# Patient Record
Sex: Male | Born: 1970 | Race: Black or African American | Hispanic: No | Marital: Married | Smoking: Never smoker
Health system: Southern US, Community
[De-identification: ages and names within clinical notes are randomized; demographics above are authoritative.]

## PROBLEM LIST (undated history)

## (undated) DIAGNOSIS — K219 Gastro-esophageal reflux disease without esophagitis: Secondary | ICD-10-CM

---

## 2017-10-20 ENCOUNTER — Other Ambulatory Visit: Payer: Self-pay

## 2017-10-20 ENCOUNTER — Ambulatory Visit
Admission: RE | Admit: 2017-10-20 | Discharge: 2017-10-20 | Disposition: A | Discharge status: Home / Self Care | Payer: Worker's Compensation | Source: Ambulatory Visit | Attending: Orthopedic Surgery | Admitting: Orthopedic Surgery

## 2017-10-20 ENCOUNTER — Other Ambulatory Visit: Payer: Self-pay | Admitting: Orthopedic Surgery

## 2017-10-20 DIAGNOSIS — R0602 Shortness of breath: Secondary | ICD-10-CM

## 2017-10-20 DIAGNOSIS — R0781 Pleurodynia: Secondary | ICD-10-CM

## 2017-10-31 ENCOUNTER — Ambulatory Visit (HOSPITAL_COMMUNITY): Payer: Worker's Compensation | Admitting: Certified Registered"

## 2017-10-31 ENCOUNTER — Encounter (HOSPITAL_COMMUNITY): Payer: Self-pay | Admitting: *Deleted

## 2017-10-31 ENCOUNTER — Encounter (HOSPITAL_COMMUNITY)
Admission: AD | Disposition: A | Discharge status: Home / Self Care | Payer: Self-pay | Source: Ambulatory Visit | Attending: Orthopedic Surgery

## 2017-10-31 ENCOUNTER — Observation Stay (HOSPITAL_COMMUNITY)
Admission: AD | Admit: 2017-10-31 | Discharge: 2017-11-01 | Disposition: A | Discharge status: Home / Self Care | Payer: Worker's Compensation | Source: Ambulatory Visit | Attending: Orthopedic Surgery | Admitting: Orthopedic Surgery

## 2017-10-31 ENCOUNTER — Other Ambulatory Visit: Payer: Self-pay

## 2017-10-31 DIAGNOSIS — W19XXXA Unspecified fall, initial encounter: Secondary | ICD-10-CM | POA: Diagnosis not present

## 2017-10-31 DIAGNOSIS — S2232XA Fracture of one rib, left side, initial encounter for closed fracture: Secondary | ICD-10-CM | POA: Diagnosis not present

## 2017-10-31 DIAGNOSIS — Z886 Allergy status to analgesic agent status: Secondary | ICD-10-CM | POA: Diagnosis not present

## 2017-10-31 DIAGNOSIS — M7022 Olecranon bursitis, left elbow: Secondary | ICD-10-CM | POA: Insufficient documentation

## 2017-10-31 DIAGNOSIS — S52501A Unspecified fracture of the lower end of right radius, initial encounter for closed fracture: Secondary | ICD-10-CM | POA: Diagnosis not present

## 2017-10-31 DIAGNOSIS — Z6836 Body mass index (BMI) 36.0-36.9, adult: Secondary | ICD-10-CM | POA: Insufficient documentation

## 2017-10-31 DIAGNOSIS — S46312A Strain of muscle, fascia and tendon of triceps, left arm, initial encounter: Secondary | ICD-10-CM | POA: Diagnosis not present

## 2017-10-31 DIAGNOSIS — S46319A Strain of muscle, fascia and tendon of triceps, unspecified arm, initial encounter: Secondary | ICD-10-CM | POA: Diagnosis present

## 2017-10-31 DIAGNOSIS — S46312S Strain of muscle, fascia and tendon of triceps, left arm, sequela: Secondary | ICD-10-CM

## 2017-10-31 HISTORY — PX: TRICEPS TENDON REPAIR: SHX2577

## 2017-10-31 HISTORY — PX: CAST APPLICATION: SHX380

## 2017-10-31 HISTORY — DX: Gastro-esophageal reflux disease without esophagitis: K21.9

## 2017-10-31 LAB — CBC
HCT: 42 % (ref 39.0–52.0)
HEMOGLOBIN: 13.4 g/dL (ref 13.0–17.0)
MCH: 26.1 pg (ref 26.0–34.0)
MCHC: 31.9 g/dL (ref 30.0–36.0)
MCV: 81.9 fL (ref 78.0–100.0)
PLATELETS: 288 10*3/uL (ref 150–400)
RBC: 5.13 MIL/uL (ref 4.22–5.81)
RDW: 15.8 % — AB (ref 11.5–15.5)
WBC: 7.7 10*3/uL (ref 4.0–10.5)

## 2017-10-31 SURGERY — REPAIR, TENDON, TRICEPS
Anesthesia: General | Laterality: Right

## 2017-10-31 MED ORDER — ONDANSETRON HCL 4 MG/2ML IJ SOLN
INTRAMUSCULAR | Status: AC
  Filled 2017-10-31: qty 2

## 2017-10-31 MED ORDER — CEFAZOLIN SODIUM-DEXTROSE 1-4 GM/50ML-% IV SOLN
1.0000 g | Freq: Three times a day (TID) | INTRAVENOUS | Status: DC
  Administered 2017-10-31 – 2017-11-01 (×2): 1 g via INTRAVENOUS
  Filled 2017-10-31 (×4): qty 50

## 2017-10-31 MED ORDER — FENTANYL CITRATE (PF) 100 MCG/2ML IJ SOLN: 25.0000 ug | INTRAMUSCULAR | Status: DC | PRN

## 2017-10-31 MED ORDER — DEXAMETHASONE SODIUM PHOSPHATE 10 MG/ML IJ SOLN
INTRAMUSCULAR | Status: DC | PRN
  Administered 2017-10-31: 10 mg via INTRAVENOUS

## 2017-10-31 MED ORDER — MIDAZOLAM HCL 2 MG/2ML IJ SOLN
INTRAMUSCULAR | Status: AC
  Filled 2017-10-31: qty 2

## 2017-10-31 MED ORDER — ACETAMINOPHEN 325 MG PO TABS: 325.0000 mg | ORAL_TABLET | ORAL | Status: DC | PRN

## 2017-10-31 MED ORDER — FENTANYL CITRATE (PF) 250 MCG/5ML IJ SOLN
INTRAMUSCULAR | Status: AC
  Filled 2017-10-31: qty 5

## 2017-10-31 MED ORDER — PROMETHAZINE HCL 12.5 MG RE SUPP
12.5000 mg | Freq: Four times a day (QID) | RECTAL | Status: DC | PRN
  Filled 2017-10-31: qty 1

## 2017-10-31 MED ORDER — ONDANSETRON HCL 4 MG/2ML IJ SOLN: 4.0000 mg | Freq: Once | INTRAMUSCULAR | Status: DC | PRN

## 2017-10-31 MED ORDER — OXYCODONE HCL 5 MG PO TABS
5.0000 mg | ORAL_TABLET | ORAL | Status: DC | PRN
  Administered 2017-10-31 – 2017-11-01 (×5): 10 mg via ORAL
  Filled 2017-10-31 (×5): qty 2

## 2017-10-31 MED ORDER — FAMOTIDINE 20 MG PO TABS: 20.0000 mg | ORAL_TABLET | Freq: Two times a day (BID) | ORAL | Status: DC | PRN

## 2017-10-31 MED ORDER — 0.9 % SODIUM CHLORIDE (POUR BTL) OPTIME
TOPICAL | Status: DC | PRN
  Administered 2017-10-31: 1000 mL

## 2017-10-31 MED ORDER — MORPHINE SULFATE (PF) 2 MG/ML IV SOLN
1.0000 mg | INTRAVENOUS | Status: DC | PRN
  Administered 2017-10-31 – 2017-11-01 (×3): 1 mg via INTRAVENOUS
  Filled 2017-10-31 (×3): qty 1

## 2017-10-31 MED ORDER — FENTANYL CITRATE (PF) 100 MCG/2ML IJ SOLN
INTRAMUSCULAR | Status: DC | PRN
  Administered 2017-10-31 (×6): 50 ug via INTRAVENOUS
  Administered 2017-10-31: 100 ug via INTRAVENOUS

## 2017-10-31 MED ORDER — PROPOFOL 10 MG/ML IV BOLUS
INTRAVENOUS | Status: AC
Start: 2017-10-31 — End: 2017-10-31
  Filled 2017-10-31: qty 20

## 2017-10-31 MED ORDER — MIDAZOLAM HCL 5 MG/5ML IJ SOLN
INTRAMUSCULAR | Status: DC | PRN
  Administered 2017-10-31: 1 mg via INTRAVENOUS

## 2017-10-31 MED ORDER — DOCUSATE SODIUM 100 MG PO CAPS
100.0000 mg | ORAL_CAPSULE | Freq: Two times a day (BID) | ORAL | Status: DC
  Administered 2017-10-31 – 2017-11-01 (×2): 100 mg via ORAL
  Filled 2017-10-31 (×2): qty 1

## 2017-10-31 MED ORDER — LACTATED RINGERS IV SOLN
INTRAVENOUS | Status: DC
  Administered 2017-11-01: 08:00:00 via INTRAVENOUS

## 2017-10-31 MED ORDER — KETOROLAC TROMETHAMINE 30 MG/ML IJ SOLN: 30.0000 mg | Freq: Once | INTRAMUSCULAR | Status: DC | PRN

## 2017-10-31 MED ORDER — OXYCODONE HCL 5 MG/5ML PO SOLN: 5.0000 mg | Freq: Once | ORAL | Status: DC | PRN

## 2017-10-31 MED ORDER — OXYCODONE HCL 5 MG PO TABS: 5.0000 mg | ORAL_TABLET | Freq: Once | ORAL | Status: DC | PRN

## 2017-10-31 MED ORDER — GLYCOPYRROLATE 0.2 MG/ML IV SOSY
PREFILLED_SYRINGE | INTRAVENOUS | Status: DC | PRN
Start: 2017-10-31 — End: 2017-10-31
  Administered 2017-10-31: 0.4 mg via INTRAVENOUS

## 2017-10-31 MED ORDER — ALPRAZOLAM 0.5 MG PO TABS: 0.5000 mg | ORAL_TABLET | Freq: Four times a day (QID) | ORAL | Status: DC | PRN

## 2017-10-31 MED ORDER — MIDAZOLAM HCL 2 MG/2ML IJ SOLN
INTRAMUSCULAR | Status: AC
  Administered 2017-10-31: 2 mg via INTRAVENOUS
  Filled 2017-10-31: qty 2

## 2017-10-31 MED ORDER — CEFAZOLIN SODIUM-DEXTROSE 1-4 GM/50ML-% IV SOLN: 1.0000 g | INTRAVENOUS | Status: DC

## 2017-10-31 MED ORDER — ACETAMINOPHEN 160 MG/5ML PO SOLN: 325.0000 mg | ORAL | Status: DC | PRN

## 2017-10-31 MED ORDER — ONDANSETRON HCL 4 MG/2ML IJ SOLN
INTRAMUSCULAR | Status: DC | PRN
  Administered 2017-10-31: 4 mg via INTRAVENOUS

## 2017-10-31 MED ORDER — METHOCARBAMOL 500 MG PO TABS
500.0000 mg | ORAL_TABLET | Freq: Four times a day (QID) | ORAL | Status: DC | PRN
  Administered 2017-10-31 – 2017-11-01 (×3): 500 mg via ORAL
  Filled 2017-10-31 (×3): qty 1

## 2017-10-31 MED ORDER — ONDANSETRON HCL 4 MG/2ML IJ SOLN: 4.0000 mg | Freq: Four times a day (QID) | INTRAMUSCULAR | Status: DC | PRN

## 2017-10-31 MED ORDER — MEPERIDINE HCL 50 MG/ML IJ SOLN: 6.2500 mg | INTRAMUSCULAR | Status: DC | PRN

## 2017-10-31 MED ORDER — FENTANYL CITRATE (PF) 100 MCG/2ML IJ SOLN
100.0000 ug | Freq: Once | INTRAMUSCULAR | Status: AC
  Administered 2017-10-31: 100 ug via INTRAVENOUS

## 2017-10-31 MED ORDER — ASPIRIN EC 325 MG PO TBEC
325.0000 mg | DELAYED_RELEASE_TABLET | Freq: Every day | ORAL | Status: DC
  Administered 2017-10-31 – 2017-11-01 (×2): 325 mg via ORAL
  Filled 2017-10-31 (×2): qty 1

## 2017-10-31 MED ORDER — LACTATED RINGERS IV SOLN
INTRAVENOUS | Status: DC
  Administered 2017-10-31 (×2): via INTRAVENOUS

## 2017-10-31 MED ORDER — CEFAZOLIN SODIUM 1 G IJ SOLR
INTRAMUSCULAR | Status: AC
  Filled 2017-10-31: qty 20

## 2017-10-31 MED ORDER — BUPIVACAINE HCL (PF) 0.25 % IJ SOLN
INTRAMUSCULAR | Status: AC
  Filled 2017-10-31: qty 30

## 2017-10-31 MED ORDER — LIDOCAINE 2% (20 MG/ML) 5 ML SYRINGE
INTRAMUSCULAR | Status: DC | PRN
  Administered 2017-10-31: 80 mg via INTRAVENOUS

## 2017-10-31 MED ORDER — CEFAZOLIN SODIUM-DEXTROSE 2-4 GM/100ML-% IV SOLN
2.0000 g | Freq: Once | INTRAVENOUS | Status: AC
  Administered 2017-10-31: 2 g via INTRAVENOUS

## 2017-10-31 MED ORDER — METHOCARBAMOL 1000 MG/10ML IJ SOLN
500.0000 mg | Freq: Four times a day (QID) | INTRAVENOUS | Status: DC | PRN
  Filled 2017-10-31: qty 5

## 2017-10-31 MED ORDER — MEPIVACAINE HCL 1.5 % IJ SOLN
INTRAMUSCULAR | Status: DC | PRN
  Administered 2017-10-31: 25 mL via EPIDURAL

## 2017-10-31 MED ORDER — MIDAZOLAM HCL 2 MG/2ML IJ SOLN
2.0000 mg | Freq: Once | INTRAMUSCULAR | Status: AC
  Administered 2017-10-31: 2 mg via INTRAVENOUS

## 2017-10-31 MED ORDER — FENTANYL CITRATE (PF) 100 MCG/2ML IJ SOLN
INTRAMUSCULAR | Status: AC
  Administered 2017-10-31: 100 ug via INTRAVENOUS
  Filled 2017-10-31: qty 2

## 2017-10-31 MED ORDER — ONDANSETRON HCL 4 MG PO TABS: 4.0000 mg | ORAL_TABLET | Freq: Four times a day (QID) | ORAL | Status: DC | PRN

## 2017-10-31 MED ORDER — SUGAMMADEX SODIUM 200 MG/2ML IV SOLN
INTRAVENOUS | Status: AC
  Filled 2017-10-31: qty 2

## 2017-10-31 MED ORDER — DEXAMETHASONE SODIUM PHOSPHATE 10 MG/ML IJ SOLN
INTRAMUSCULAR | Status: AC
  Filled 2017-10-31: qty 1

## 2017-10-31 MED ORDER — CEFAZOLIN SODIUM-DEXTROSE 2-4 GM/100ML-% IV SOLN
INTRAVENOUS | Status: AC
  Filled 2017-10-31: qty 100

## 2017-10-31 MED ORDER — ROCURONIUM BROMIDE 10 MG/ML (PF) SYRINGE
PREFILLED_SYRINGE | INTRAVENOUS | Status: AC
  Filled 2017-10-31: qty 5

## 2017-10-31 MED ORDER — VITAMIN C 500 MG PO TABS
1000.0000 mg | ORAL_TABLET | Freq: Every day | ORAL | Status: DC
  Administered 2017-10-31 – 2017-11-01 (×2): 1000 mg via ORAL
  Filled 2017-10-31 (×2): qty 2

## 2017-10-31 MED ORDER — PROPOFOL 10 MG/ML IV BOLUS
INTRAVENOUS | Status: DC | PRN
  Administered 2017-10-31: 200 mg via INTRAVENOUS

## 2017-10-31 SURGICAL SUPPLY — 69 items
BANDAGE ACE 3X5.8 VEL STRL LF (GAUZE/BANDAGES/DRESSINGS) IMPLANT
BANDAGE ACE 4X5 VEL STRL LF (GAUZE/BANDAGES/DRESSINGS) ×4 IMPLANT
BANDAGE ELASTIC 4 VELCRO ST LF (GAUZE/BANDAGES/DRESSINGS) ×12 IMPLANT
BNDG GAUZE ELAST 4 BULKY (GAUZE/BANDAGES/DRESSINGS) ×8 IMPLANT
CORDS BIPOLAR (ELECTRODE) ×8 IMPLANT
COVER SURGICAL LIGHT HANDLE (MISCELLANEOUS) ×4 IMPLANT
CUFF TOURNIQUET SINGLE 18IN (TOURNIQUET CUFF) IMPLANT
CUFF TOURNIQUET SINGLE 24IN (TOURNIQUET CUFF) ×4 IMPLANT
DECANTER SPIKE VIAL GLASS SM (MISCELLANEOUS) IMPLANT
DRAPE OEC MINIVIEW 54X84 (DRAPES) IMPLANT
DRAPE ORTHO SPLIT 77X108 STRL (DRAPES) ×2
DRAPE SURG 17X23 STRL (DRAPES) ×4 IMPLANT
DRAPE SURG ORHT 6 SPLT 77X108 (DRAPES) ×2 IMPLANT
DRSG ADAPTIC 3X8 NADH LF (GAUZE/BANDAGES/DRESSINGS) ×4 IMPLANT
EVACUATOR 1/8 PVC DRAIN (DRAIN) ×4 IMPLANT
GAUZE SPONGE 4X4 12PLY STRL (GAUZE/BANDAGES/DRESSINGS) ×4 IMPLANT
GAUZE XEROFORM 1X8 LF (GAUZE/BANDAGES/DRESSINGS) IMPLANT
GAUZE XEROFORM 5X9 LF (GAUZE/BANDAGES/DRESSINGS) ×4 IMPLANT
GLOVE BIOGEL M 8.0 STRL (GLOVE) IMPLANT
GLOVE BIOGEL PI IND STRL 6.5 (GLOVE) ×4 IMPLANT
GLOVE BIOGEL PI IND STRL 8 (GLOVE) ×2 IMPLANT
GLOVE BIOGEL PI INDICATOR 6.5 (GLOVE) ×4
GLOVE BIOGEL PI INDICATOR 8 (GLOVE) ×2
GLOVE SS BIOGEL STRL SZ 8 (GLOVE) ×2 IMPLANT
GLOVE SUPERSENSE BIOGEL SZ 8 (GLOVE) ×2
GLOVE SURG SS PI 6.5 STRL IVOR (GLOVE) ×8 IMPLANT
GOWN STRL REUS W/ TWL LRG LVL3 (GOWN DISPOSABLE) ×4 IMPLANT
GOWN STRL REUS W/ TWL XL LVL3 (GOWN DISPOSABLE) ×6 IMPLANT
GOWN STRL REUS W/TWL LRG LVL3 (GOWN DISPOSABLE) ×4
GOWN STRL REUS W/TWL XL LVL3 (GOWN DISPOSABLE) ×6
KIT BASIN OR (CUSTOM PROCEDURE TRAY) ×4 IMPLANT
KIT ROOM TURNOVER OR (KITS) ×4 IMPLANT
MANIFOLD NEPTUNE II (INSTRUMENTS) IMPLANT
NEEDLE HYPO 25GX1X1/2 BEV (NEEDLE) IMPLANT
NEEDLE KEITH (NEEDLE) IMPLANT
NEEDLE TAPERED W/ NITINOL LOOP (MISCELLANEOUS) ×4 IMPLANT
NS IRRIG 1000ML POUR BTL (IV SOLUTION) ×4 IMPLANT
PACK ORTHO EXTREMITY (CUSTOM PROCEDURE TRAY) ×4 IMPLANT
PAD ARMBOARD 7.5X6 YLW CONV (MISCELLANEOUS) ×8 IMPLANT
PAD CAST 4YDX4 CTTN HI CHSV (CAST SUPPLIES) ×2 IMPLANT
PADDING CAST COTTON 4X4 STRL (CAST SUPPLIES) ×2
PASSER SUT SWANSON 36MM LOOP (INSTRUMENTS) ×4 IMPLANT
SCOTCHCAST PLUS 2X4 WHITE (CAST SUPPLIES) ×12 IMPLANT
SCRUB BETADINE 4OZ XXX (MISCELLANEOUS) ×4 IMPLANT
SOL PREP POV-IOD 4OZ 10% (MISCELLANEOUS) ×4 IMPLANT
SPLINT FIBERGLASS 4X30 (CAST SUPPLIES) ×8 IMPLANT
SUCTION FRAZIER TIP 10 FR DISP (SUCTIONS) ×4 IMPLANT
SUT FIBERWIRE #2 38 T-5 BLUE (SUTURE)
SUT FIBERWIRE 2-0 18 17.9 3/8 (SUTURE) ×8
SUT PROLENE 3 0 PS 2 (SUTURE) ×12 IMPLANT
SUT PROLENE 4 0 PS 2 18 (SUTURE) IMPLANT
SUT VIC AB 2-0 CT1 27 (SUTURE)
SUT VIC AB 2-0 CT1 TAPERPNT 27 (SUTURE) IMPLANT
SUT VIC AB 3-0 SH 27 (SUTURE) ×2
SUT VIC AB 3-0 SH 27X BRD (SUTURE) ×2 IMPLANT
SUTURE FIBERWR #2 38 T-5 BLUE (SUTURE) IMPLANT
SUTURE FIBERWR 2-0 18 17.9 3/8 (SUTURE) ×4 IMPLANT
SUTURE TAPE 1.3 40 TPR END (SUTURE) ×6 IMPLANT
SUTURE TAPE FIBERLINK 1.3 LOOP (SUTURE) ×4 IMPLANT
SUTURETAPE 1.3 40 TPR END (SUTURE) ×12
SUTURETAPE FIBERLINK 1.3 LOOP (SUTURE) ×8
SWIVELOCK BIO CLOSED 4.75 (Orthopedic Implant) ×4 IMPLANT
SYR CONTROL 10ML LL (SYRINGE) IMPLANT
TOWEL OR 17X24 6PK STRL BLUE (TOWEL DISPOSABLE) ×4 IMPLANT
TOWEL OR 17X26 10 PK STRL BLUE (TOWEL DISPOSABLE) ×4 IMPLANT
TUBE CONNECTING 12'X1/4 (SUCTIONS)
TUBE CONNECTING 12X1/4 (SUCTIONS) IMPLANT
UNDERPAD 30X30 (UNDERPADS AND DIAPERS) ×4 IMPLANT
WATER STERILE IRR 1000ML POUR (IV SOLUTION) ×4 IMPLANT

## 2017-10-31 NOTE — Anesthesia Procedure Notes (Signed)
Procedure Name: LMA Insertion Date/Time: 10/31/2017 4:44 PM Performed by: Demetrio LappingSmith, Skyler Dusing P, CRNA Pre-anesthesia Checklist: Patient identified, Emergency Drugs available, Suction available and Patient being monitored Patient Re-evaluated:Patient Re-evaluated prior to induction Oxygen Delivery Method: Circle System Utilized Preoxygenation: Pre-oxygenation with 100% oxygen Induction Type: IV induction Ventilation: Mask ventilation without difficulty LMA: LMA inserted LMA Size: 5.0 Number of attempts: 1 Placement Confirmation: positive ETCO2 Tube secured with: Tape Dental Injury: Teeth and Oropharynx as per pre-operative assessment

## 2017-10-31 NOTE — H&P (Signed)
Stanley Alexander is an 47 y.o. male.   Chief Complaint: Patient presents with a left triceps tendon rupture as well as a right distal radius fracture.  He is casted on the right side at present time his left elbow is here for surgicalIntervention.  HPI: Patient presents for evaluation and care.  Patient has a history of fall 3 weeks ago with left triceps tendon rupture and a right distal radius fracture.  We are planning surgical intervention in the form of distal triceps repair and reconstruction on the left side with ulnar nerve release is necessary.  We are also planning a cast change on the right side.  He denies numbness tingling in lower extremities.  He denies abdominal pain or chest pain that is changed.  He has had some mild left chest wall pain due to a left fifth rib anterior fracture.  This was noted on CT of the chest.  I reviewed these issues with him at length.  He has had studies from outside emergency room 3 weeks ago revealing normal CT of the head and neck.  Past Medical History:  Diagnosis Date  . GERD (gastroesophageal reflux disease)     History reviewed. No pertinent surgical history.  History reviewed. No pertinent family history. Social History:  reports that  has never smoked. he has never used smokeless tobacco. He reports that he does not drink alcohol or use drugs.  Allergies:  Allergies  Allergen Reactions  . Naproxen Sodium Rash    Medications Prior to Admission  Medication Sig Dispense Refill  . aspirin EC 325 MG tablet Take 325 mg by mouth daily.    . methocarbamol (ROBAXIN) 500 MG tablet Take 500 mg by mouth every 6 (six) hours as needed for muscle spasms.  0  . Multiple Vitamins-Minerals (MULTILEX) TABS Take 1 tablet by mouth daily.    Marland Kitchen. oxyCODONE (OXY IR/ROXICODONE) 5 MG immediate release tablet Take 5 mg by mouth every 6 (six) hours as needed for pain.  0    Results for orders placed or performed during the hospital encounter of 10/31/17 (from the  past 48 hour(s))  CBC     Status: Abnormal   Collection Time: 10/31/17 12:40 PM  Result Value Ref Range   WBC 7.7 4.0 - 10.5 K/uL   RBC 5.13 4.22 - 5.81 MIL/uL   Hemoglobin 13.4 13.0 - 17.0 g/dL   HCT 96.042.0 45.439.0 - 09.852.0 %   MCV 81.9 78.0 - 100.0 fL   MCH 26.1 26.0 - 34.0 pg   MCHC 31.9 30.0 - 36.0 g/dL   RDW 11.915.8 (H) 14.711.5 - 82.915.5 %   Platelets 288 150 - 400 K/uL    Comment: Performed at [facility], [address] [place], [place] [address]   No results found.  Review of Systems  HENT: Negative.   Eyes: Negative.   Respiratory: Negative for cough, hemoptysis and sputum production.        History of left fifth anterior rib fracture  Cardiovascular: Negative.   Gastrointestinal: Negative.   Genitourinary: Negative.   Skin: Negative.     Blood pressure (!) 149/78, pulse 64, temperature 97.9 F (36.6 C), temperature source Oral, resp. rate 20, height 5\' 8"  (1.727 m), weight 108.9 kg (240 lb), SpO2 96 %. Physical Exam patient has a left distal biceps tendon rupture.  This is painful.  Patient has no signs of infection or dystrophy.  He has some left chest wall pain with firm palpation due to  1/5 anterior rib fracture.  He notes no locking or catching in the fingers or hand.  He has a distal radius fracture on the right side and will plan for a cast change.  I reviewed these issues with him at length and the findings.  The patient is alert and oriented in no acute distress. The patient complains of pain in the affected upper extremity.  The patient is noted to have a normal HEENT exam. Lung fields show equal chest expansion and no shortness of breath. Abdomen exam is nontender without distention. Lower extremity examination does not show any fracture dislocation or blood clot symptoms. Pelvis is stable and the neck and back are stable and nontender. Assessment/Plan We will plan for repair left triceps with repair reconstruction is necessary and ulnar nerve release is  necessary.  I discussed all issues and plans  In addition to this we will plan for cast change on the right side.  I discussed with he and his family although she is concerned.  We will proceed accordingly.  We are planning surgery for your upper extremity. The risk and benefits of surgery to include risk of bleeding, infection, anesthesia,  damage to normal structures and failure of the surgery to accomplish its intended goals of relieving symptoms and restoring function have been discussed in detail. With this in mind we plan to proceed. I have specifically discussed with the patient the pre-and postoperative regime and the dos and don'ts and risk and benefits in great detail. Risk and benefits of surgery also include risk of dystrophy(CRPS), chronic nerve pain, failure of the healing process to go onto completion and other inherent risks of surgery The relavent the pathophysiology of the disease/injury process, as well as the alternatives for treatment and postoperative course of action has been discussed in great detail with the patient who desires to proceed.  We will do everything in our power to help you (the patient) restore function to the upper extremity. It is a pleasure to see this patient today. We will plan for overnight stay after the operation Karen Chafe, MD 10/31/2017, 4:23 PM

## 2017-10-31 NOTE — Op Note (Signed)
See full dictation#[id number]  SP Open repair triceps with CUTR and bursectomy left elbow and cast change right wrist  Jaydon Soroka MD

## 2017-10-31 NOTE — Anesthesia Preprocedure Evaluation (Addendum)
Anesthesia Evaluation  Patient identified by MRN, date of birth, ID band Patient awake    Reviewed: Allergy & Precautions, NPO status , Patient's Chart, lab work & pertinent test results  Airway Mallampati: II  TM Distance: >3 FB Neck ROM: Full    Dental no notable dental hx. (+) Poor Dentition, Loose,    Pulmonary neg pulmonary ROS,    Pulmonary exam normal breath sounds clear to auscultation       Cardiovascular negative cardio ROS Normal cardiovascular exam Rhythm:Regular Rate:Normal     Neuro/Psych negative neurological ROS  negative psych ROS   GI/Hepatic Neg liver ROS, GERD  ,  Endo/Other  negative endocrine ROSMorbid obesity  Renal/GU negative Renal ROS  negative genitourinary   Musculoskeletal negative musculoskeletal ROS (+)   Abdominal   Peds negative pediatric ROS (+)  Hematology negative hematology ROS (+)   Anesthesia Other Findings   Reproductive/Obstetrics negative OB ROS                            Anesthesia Physical Anesthesia Plan  ASA: II  Anesthesia Plan: General   Post-op Pain Management:    Induction: Intravenous  PONV Risk Score and Plan: 2 and Treatment may vary due to age or medical condition, Ondansetron and Dexamethasone  Airway Management Planned: LMA and Oral ETT  Additional Equipment:   Intra-op Plan:   Post-operative Plan: Extubation in OR  Informed Consent: I have reviewed the patients History and Physical, chart, labs and discussed the procedure including the risks, benefits and alternatives for the proposed anesthesia with the patient or authorized representative who has indicated his/her understanding and acceptance.   Dental Advisory Given  Plan Discussed with: CRNA, Surgeon and Anesthesiologist  Anesthesia Plan Comments:         Anesthesia Quick Evaluation

## 2017-10-31 NOTE — Transfer of Care (Signed)
Immediate Anesthesia Transfer of Care Note  Patient: Mikle BosworthCarlos A Allum  Procedure(s) Performed: Left triceps rupture repair with ulna nerve release and repair (Left ) CAST APPLICATION TO RIGHT ARM (Right )  Patient Location: PACU  Anesthesia Type:GA combined with regional for post-op pain  Level of Consciousness: awake, alert , oriented and patient cooperative  Airway & Oxygen Therapy: Patient Spontanous Breathing and Patient connected to nasal cannula oxygen  Post-op Assessment: Report given to RN, Post -op Vital signs reviewed and stable and Patient moving all extremities  Post vital signs: Reviewed and stable  Last Vitals:  Vitals:   10/31/17 1625 10/31/17 1630  BP:  (!) 164/84  Pulse: 76 73  Resp: 15 (!) 21  Temp:    SpO2: 94% 97%    Last Pain:  Vitals:   10/31/17 1630  TempSrc:   PainSc: 0-No pain      Patients Stated Pain Goal: 3 (10/31/17 1221)  Complications: No apparent anesthesia complications

## 2017-10-31 NOTE — Anesthesia Postprocedure Evaluation (Signed)
Anesthesia Post Note  Patient: Stanley Alexander  Procedure(s) Performed: Left triceps rupture repair with ulna nerve release and repair (Left ) CAST APPLICATION TO RIGHT ARM (Right )     Patient location during evaluation: PACU Anesthesia Type: General Level of consciousness: awake and alert Pain management: pain level controlled Vital Signs Assessment: post-procedure vital signs reviewed and stable Respiratory status: spontaneous breathing, nonlabored ventilation, respiratory function stable and patient connected to nasal cannula oxygen Cardiovascular status: blood pressure returned to baseline and stable Postop Assessment: no apparent nausea or vomiting Anesthetic complications: no    Last Vitals:  Vitals:   10/31/17 1945 10/31/17 2012  BP: (!) 149/78 (!) 149/80  Pulse:  93  Resp:  16  Temp: (!) 36.3 C 37 C  SpO2:  93%    Last Pain:  Vitals:   10/31/17 2012  TempSrc: Oral  PainSc: 0-No pain                 Soley Harriss

## 2017-10-31 NOTE — Anesthesia Procedure Notes (Addendum)
Anesthesia Regional Block: Supraclavicular block   Pre-Anesthetic Checklist: ,, timeout performed, Correct Patient, Correct Site, Correct Laterality, Correct Procedure, Correct Position, site marked, Risks and benefits discussed,  Surgical consent,  Pre-op evaluation,  At surgeon's request and post-op pain management  Laterality: Left  Prep: chloraprep       Needles:  Injection technique: Single-shot  Needle Type: Echogenic Stimulator Needle     Needle Length: 5cm  Needle Gauge: 22     Additional Needles:   Procedures:, nerve stimulator,,, ultrasound used (permanent image in chart),,,,   Nerve Stimulator or Paresthesia:  Response: deltoid, 0.45 mA,   Additional Responses:   Narrative:  Start time: 10/31/2017 4:28 PM End time: 10/31/2017 4:30 PM Injection made incrementally with aspirations every 5 mL.  Performed by: Personally  Anesthesiologist: Bethena Midgetddono, Yianni Skilling, MD  Additional Notes: Functioning IV was confirmed and monitors were applied.  A 50mm 22ga Arrow echogenic stimulator needle was used. Sterile prep and drape,hand hygiene and sterile gloves were used. Ultrasound guidance: relevant anatomy identified, needle position confirmed, local anesthetic spread visualized around nerve(s)., vascular puncture avoided.  Image printed for medical record. Negative aspiration and negative test dose prior to incremental administration of local anesthetic. The patient tolerated the procedure well.

## 2017-11-01 DIAGNOSIS — S52501A Unspecified fracture of the lower end of right radius, initial encounter for closed fracture: Secondary | ICD-10-CM | POA: Diagnosis not present

## 2017-11-01 NOTE — Progress Notes (Signed)
Requested 3/1 to [facility]

## 2017-11-01 NOTE — Discharge Instructions (Signed)

## 2017-11-01 NOTE — Evaluation (Signed)
Physical Therapy Evaluation and Discharge Patient Details Name: Stanley Alexander MRN: [id number] DOB: [birth year] Today's Date: 11/01/2017   History of Present Illness  47 y.o.male presents with a left triceps tendon rupture, left fifth rib anterior fx, and right distal radius fracture. S/p tripcep rupture repair and ulna nerve release and right wrist casted.      Clinical Impression  Patient evaluated by Physical Therapy with no further acute PT needs identified. All education has been completed and the patient has no further questions.  See below for any follow-up Physical Therapy or equipment needs. PT is signing off. Thank you for this referral.      Follow Up Recommendations No PT follow up;Supervision for mobility/OOB    Equipment Recommendations  None recommended by PT    Recommendations for Other Services       Precautions / Restrictions Restrictions Weight Bearing Restrictions: Yes RUE Weight Bearing: Non weight bearing LUE Weight Bearing: Non weight bearing Other Position/Activity Restrictions: No formal orders. Using conservative approach      Mobility  Bed Mobility Overal bed mobility: Needs Assistance Bed Mobility: Supine to Sit     Supine to sit: Mod assist     General bed mobility comments: Mod A to elevate trunk with wife's help.   Transfers Overall transfer level: Modified independent                  Ambulation/Gait Ambulation/Gait assistance: Modified independent (Device/Increase time) Ambulation Distance (Feet): 200 Feet Assistive device: None Gait Pattern/deviations: WFL(Within Functional Limits)        Stairs Stairs: Yes Stairs assistance: Supervision Stair Management: No rails Number of Stairs: 24 General stair comments: cues for sequencing in light of leg pain, step to gait, family safe guarding   Wheelchair Mobility    Modified Rankin (Stroke Patients Only)       Balance Overall balance assessment: Mild deficits  observed, not formally tested                                           Pertinent Vitals/Pain Pain Assessment: 0-10 Pain Score: 7  Pain Location: LUE>RUE; ribs left side Pain Descriptors / Indicators: Constant;Discomfort;Grimacing Pain Intervention(s): Limited activity within patient's tolerance;Monitored during session;Premedicated before session;Repositioned    Home Living Family/patient expects to be discharged to:: Private residence Living Arrangements: Spouse/significant other Available Help at Discharge: Family;Available 24 hours/day Type of Home: House Home Access: Stairs to enter   [employer] of Steps: 7 Home Layout: Two level;Able to live on main level with bedroom/bathroom Home Equipment: None      Prior Function Level of Independence: Independent               Hand Dominance   Dominant Hand: Right    Extremity/Trunk Assessment   Upper Extremity Assessment Upper Extremity Assessment: Defer to OT evaluation    Lower Extremity Assessment Lower Extremity Assessment: Overall WFL for tasks assessed    Cervical / Trunk Assessment Cervical / Trunk Assessment: Other exceptions Cervical / Trunk Exceptions: increased body habitus  Communication   Communication: No difficulties  Cognition Arousal/Alertness: Awake/alert Behavior During Therapy: WFL for tasks assessed/performed Overall Cognitive Status: Within Functional Limits for tasks assessed  General Comments      Exercises Other Exercises Other Exercises: Educated on edema management   Assessment/Plan    PT Assessment Patent does not need any further PT services  PT Problem List         PT Treatment Interventions      PT Goals (Current goals can be found in the Care Plan section)  Acute Rehab PT Goals Patient Stated Goal: Go home today    Frequency     Barriers to discharge        Co-evaluation                AM-PAC PT "6 Clicks" Daily Activity  Outcome Measure Difficulty turning over in bed (including adjusting bedclothes, sheets and blankets)?: A Little Difficulty moving from lying on back to sitting on the side of the bed? : A Little Difficulty sitting down on and standing up from a chair with arms (e.g., wheelchair, bedside commode, etc,.)?: A Little Help needed moving to and from a bed to chair (including a wheelchair)?: None Help needed walking in hospital room?: None Help needed climbing 3-5 steps with a railing? : None 6 Click Score: 21    End of Session Equipment Utilized During Treatment: Gait belt Activity Tolerance: Patient tolerated treatment well     PT Visit Diagnosis: Unsteadiness on feet (R26.81);Pain Pain - Right/Left: (BUE) Pain - part of body: Arm    Time: [id number] PT Time Calculation (min) (ACUTE ONLY): 34 min   Charges:   PT Evaluation $PT Eval Low Complexity: 1 Low PT Treatments $Gait Training: 8-22 mins   PT G Codes:        Etta GrandchildSean Norlene Lanes, PT, DPT Acute Rehab Services Pager: [phone]   Etta GrandchildSean  Nobuko Gsell 11/01/2017, 6:35 PM

## 2017-11-01 NOTE — Op Note (Signed)
NAME:  Stanley Alexander, Stanley                 ACCOUNT NO.:  MEDICAL RECORD NO.:  [id number]  LOCATION:                                 FACILITY:  PHYSICIAN:  Dionne AnoWilliam M. Amanda PeaGramig, M.D.     DATE OF BIRTH:  DATE OF PROCEDURE: DATE OF DISCHARGE:                              OPERATIVE REPORT   PREOPERATIVE DIAGNOSES: 1. Right distal radius fracture. 2. Left triceps tendon rupture at the olecranon with associated     bursitis.  POSTOPERATIVE DIAGNOSES: 1. Right distal radius fracture. 2. Left triceps tendon rupture at the olecranon with associated     bursitis.  PROCEDURES: 1. Cast change, right wrist. 2. Open left triceps repair with Arthrex pants-over-vest technique     through bony drill holes utilizing a 4.75 SwiveLock anchor and a     SutureBridge technique. 3. Extensive bursectomy, left elbow. 4. Ulnar nerve release in-situ, left elbow.  SURGEON:  Dionne AnoWilliam M. Amanda PeaGramig, MD.  ASSISTANT:  None.  COMPLICATIONS:  None.  ANESTHESIA:  General with preoperative block.  TOURNIQUET TIME:  Less than 60 minutes.  INDICATIONS:  This is a 47 year old male with history of knee contusion on the left side, distal radius fracture on the right side, triceps rupture on the left side, and a left 5th anterior rib fracture.  He presents for surgical evaluation and care.  OPERATIVE PROCEDURE:  The patient was seen by myself and Anesthesia, taken to the operative theater and underwent smooth induction of general anesthetic.  I very carefully placed him in a sloppy lateral position and prepped him with 2 Hibiclens scrubs followed by drying followed by Betadine scrub and paint.  I performed this myself and then performed sterile draping.  SCDs were placed on one of the two legs as one of the legs had to have a blood pressure cuff on it.  An IV access was in the right antecubital fossa with cast in place.  The patient tolerated this reasonably well.  Operation then commenced after time-out was  observed and antibiotics were given in the form of 2 g of Ancef.  A curvilinear incision was made about the left elbow.  Dissection was carried down.  A very large thick inflammatory bursitis was removed.  I removed the bursa under 4.0 loupe magnification without difficulty.  The patient had extensive bursitis throughout.  This was excised in detail.  The triceps rupture was noted.  Following this, I performed an in-situ ulnar nerve release.  The arcade of Struthers, medial intermuscular septum, 2 heads of the FCU, Osborne's ligament, cubital tunnel were all released without difficulty.  This was of course done as we were going to theoretically shorten the triceps after its rupture and I did not want to cause any degree of potential ulnar nerve compressive symptoms.  Following this, I freshened the triceps tendon, mobilized this and then placed 2 fiber sutures in a modified Krackow fashion from proximal to distal with 4 strands exiting distally.  Following this, a 2-0 drill bit was used in the olecranon and exited about the distal portion of the ulna.  I then threaded the sutures through and following this placed crisscrossing stitches through the FiberLoop previously  placed to allow for a SutureBridge technique and the bony tunnel technique.  The area was then reduced and all looked well.  At this time, I made a drill hole for the 4.75 SwiveLock, tapped this and then placed the arm in full extension, tighten the area down and then under tension placed the 4.5 SwiveLock.  This was beveled with the bone.  There were no protruding sutures.  All looked well.  I was pleased with the findings.  Passive range of motion looked excellent without gapping.  I irrigated copiously.  I then checked the ulnar nerve and made sure there were no constricting areas.  The wound was then irrigated copiously and closed over a Hemovac drain with 3-0 Vicryl and 3-0 Prolene.  The patient tolerated this well.   He had soft compartments, excellent pulse.  There were no complicating features.  He was placed in a long-arm splint with anterior and posterior slabs to prevent excessive flexion.  He was placed at 20 degrees.  All looked well.  The patient then had a cast change on the right upper extremity.  Cast was removed.  The arm was washed.  All looked well.  There were no complicating features or instability noted.  I very carefully handled the area followed by application of a short-arm cast.  The patient tolerated this well.  He awoke from anesthesia.  I helped take him to the recovery room and moved him over in the bed to protect the extremities.  He tolerated this well.  I discussed all issues with his family.  His wife and mother understand all issues and plans going forward.  All questions have been encouraged and answered.  It is a pleasure to see him today and participate in his care. Overnight stay.  To see him back in the office in 2 weeks, triceps protocol will be adhered to.     Dionne Ano. Amanda Pea, M.D.     [facility]  D:  10/31/2017  T:  11/01/2017  Job:  [id number]

## 2017-11-01 NOTE — Evaluation (Signed)
Occupational Therapy Evaluation Patient Details Name: Stanley Alexander MRN: [id number] DOB: 14-Apr-1971 Today's Date: 11/01/2017    History of Present Illness   47 y.o. male presents with a left triceps tendon rupture, left fifth rib anterior fx, and right distal radius fracture. S/p tripcep rupture repair and ulna nerve release and right wrist casted.    Clinical Impression   PTA, pt was living with his wife and children and was independent. Currently, pt requires Max A for dressing/bathing, Min A for grooming and feeding, and Min A for functional mobility. Provided education on compensatory techniques for bathing, dressing, edema management, toileting, and tub transfer; pt demonstrated understanding. Answered all pt questions. Recommend dc home once medically stable per physician. All acute OT needs met and will sign off. Thank you.     Follow Up Recommendations  No OT follow up;Supervision/Assistance - 24 hour    Equipment Recommendations  3 in 1 bedside commode    Recommendations for Other Services PT consult     Precautions / Restrictions Precautions Precautions: Fall Restrictions Weight Bearing Restrictions: Yes RUE Weight Bearing: Non weight bearing LUE Weight Bearing: Non weight bearing Other Position/Activity Restrictions: No formal orders. Using conservative approach      Mobility Bed Mobility Overal bed mobility: Needs Assistance Bed Mobility: Supine to Sit     Supine to sit: Mod assist     General bed mobility comments: Mod A to elevate trunk with wife's help.   Transfers Overall transfer level: Needs assistance   Transfers: Sit to/from Stand Sit to Stand: Min guard         General transfer comment: MIn Guard for safety    Balance Overall balance assessment: Mild deficits observed, not formally tested                                         ADL either performed or assessed with clinical judgement   ADL Overall ADL's : Needs  assistance/impaired Eating/Feeding: Minimal assistance;Sitting   Grooming: Minimal assistance;Sitting   Upper Body Bathing: Maximal assistance;Sitting   Lower Body Bathing: Maximal assistance;Sit to/from stand   Upper Body Dressing : Maximal assistance;Sitting   Lower Body Dressing: Maximal assistance;Sit to/from stand   Toilet Transfer: Min guard;Ambulation     Toileting - Clothing Manipulation Details (indicate cue type and reason): Discussed toilet hygiene after BM and techniques to increased indpenedence   Tub/Shower Transfer Details (indicate cue type and reason): Educated pt on tub transfer techniques. Verbalized understanding Functional mobility during ADLs: Minimal assistance General ADL Comments: Max A for bathing and dressing and Mi nA for funcitonal mobility due to decreased balance.      Vision         Perception     Praxis      Pertinent Vitals/Pain Pain Assessment: 0-10 Pain Score: 7  Pain Location: LUE>RUE; ribs left side Pain Descriptors / Indicators: Constant;Discomfort;Grimacing Pain Intervention(s): Limited activity within patient's tolerance;Monitored during session;Repositioned     Hand Dominance Right   Extremity/Trunk Assessment Upper Extremity Assessment Upper Extremity Assessment: RUE deficits/detail;LUE deficits/detail RUE Deficits / Details: Casted fingers to mid forarm RUE Coordination: decreased fine motor;decreased gross motor LUE Deficits / Details: Ace wrapped and immobilized from fingers to mid-bicep LUE Coordination: decreased fine motor;decreased gross motor   Lower Extremity Assessment Lower Extremity Assessment: Overall WFL for tasks assessed   Cervical / Trunk Assessment Cervical / Trunk Assessment: Other  exceptions Cervical / Trunk Exceptions: increased body habitus   Communication Communication Communication: No difficulties   Cognition Arousal/Alertness: Awake/alert Behavior During Therapy: WFL for tasks  assessed/performed Overall Cognitive Status: Within Functional Limits for tasks assessed                                     General Comments  Wife present throughout session    Exercises Exercises: General Upper Extremity;Other exercises General Exercises - Upper Extremity Shoulder Flexion: AROM;Both;Seated Shoulder Extension: AROM;Both;Seated Elbow Flexion: AROM;Right;Seated Elbow Extension: AROM;Right;Seated Digit Composite Flexion: AROM;Both;Seated Composite Extension: AROM;Both;Seated Other Exercises Other Exercises: Educated on edema management   Shoulder Instructions      Home Living Family/patient expects to be discharged to:: Private residence Living Arrangements: Spouse/significant other Available Help at Discharge: Family;Available 24 hours/day Type of Home: House Home Access: Stairs to enter [employer] of Steps: 7   Home Layout: Two level;Able to live on main level with bedroom/bathroom Alternate Level Stairs-Number of Steps: flight   Bathroom Shower/Tub: Teacher, early years/pre: Standard     Home Equipment: None          Prior Functioning/Environment Level of Independence: Independent                 OT Problem List: Decreased strength;Decreased range of motion;Decreased activity tolerance;Impaired balance (sitting and/or standing);Decreased safety awareness;Decreased knowledge of use of DME or AE;Decreased knowledge of precautions;Pain;Impaired UE functional use      OT Treatment/Interventions:      OT Goals(Current goals can be found in the care plan section) Acute Rehab OT Goals Patient Stated Goal: Go home today OT Goal Formulation: All assessment and education complete, DC therapy  OT Frequency:     Barriers to D/C:            Co-evaluation              AM-PAC PT "6 Clicks" Daily Activity     Outcome Measure Help from another person eating meals?: A Little Help from another person  taking care of personal grooming?: A Little Help from another person toileting, which includes using toliet, bedpan, or urinal?: A Lot Help from another person bathing (including washing, rinsing, drying)?: A Lot Help from another person to put on and taking off regular upper body clothing?: A Lot Help from another person to put on and taking off regular lower body clothing?: A Lot 6 Click Score: 14   End of Session Equipment Utilized During Treatment: Gait belt Nurse Communication: Mobility status  Activity Tolerance: Patient tolerated treatment well Patient left: in bed;with call bell/phone within reach;with family/visitor present(with PT)  OT Visit Diagnosis: Unsteadiness on feet (R26.81);Other abnormalities of gait and mobility (R26.89);Muscle weakness (generalized) (M62.81);Pain Pain - Right/Left: Left Pain - part of body: Arm                Time: 1610-9604 OT Time Calculation (min): 28 min Charges:  OT General Charges $OT Visit: 1 Visit OT Evaluation $OT Eval Moderate Complexity: 1 Mod OT Treatments $Self Care/Home Management : 8-22 mins G-Codes:     Lars Jeziorski MSOT, OTR/L Acute Rehab Pager: [phone] Office: [place] 11/01/2017, 1:40 PM
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
## 2017-11-04 ENCOUNTER — Encounter (HOSPITAL_COMMUNITY): Payer: Self-pay | Admitting: Orthopedic Surgery

## 2017-11-04 NOTE — Addendum Note (Signed)
Addendum  created 11/04/17 0807 by Bethena Midgetddono, Lanay Zinda, MD   Intraprocedure Blocks edited, Sign clinical note
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
